# Patient Record
Sex: Male | Born: 1990 | Race: Black or African American | Hispanic: No | Marital: Single | Smoking: Current every day smoker
Health system: Southern US, Community
[De-identification: ages and names within clinical notes are randomized; demographics above are authoritative.]

## PROBLEM LIST (undated history)

## (undated) DIAGNOSIS — Z9109 Other allergy status, other than to drugs and biological substances: Secondary | ICD-10-CM

## (undated) DIAGNOSIS — M419 Scoliosis, unspecified: Secondary | ICD-10-CM

## (undated) DIAGNOSIS — S72413A Displaced unspecified condyle fracture of lower end of unspecified femur, initial encounter for closed fracture: Secondary | ICD-10-CM

## (undated) DIAGNOSIS — S62309A Unspecified fracture of unspecified metacarpal bone, initial encounter for closed fracture: Secondary | ICD-10-CM

## (undated) HISTORY — DX: Displaced unspecified condyle fracture of lower end of unspecified femur, initial encounter for closed fracture: S72.413A

---
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
## 2010-03-09 ENCOUNTER — Emergency Department (HOSPITAL_COMMUNITY): Admission: EM | Admit: 2010-03-09 | Discharge: 2010-03-09 | Payer: Self-pay | Admitting: Emergency Medicine

## 2011-06-21 LAB — CBC
MCHC: 34
Platelets: 369
RDW: 14.1 — ABNORMAL HIGH

## 2011-06-21 LAB — RAPID URINE DRUG SCREEN, HOSP PERFORMED
Amphetamines: NOT DETECTED
Opiates: NOT DETECTED
Tetrahydrocannabinol: POSITIVE — AB

## 2011-06-21 LAB — COMPREHENSIVE METABOLIC PANEL
ALT: 13
AST: 24
Albumin: 4.6
Calcium: 9.3
Potassium: 3.9
Sodium: 143
Total Protein: 7.6

## 2011-06-21 LAB — DIFFERENTIAL
Eosinophils Absolute: 0.1
Lymphs Abs: 3.1
Monocytes Absolute: 0.5
Monocytes Relative: 7
Neutrophils Relative %: 45

## 2011-06-21 LAB — ETHANOL: Alcohol, Ethyl (B): 223 — ABNORMAL HIGH

## 2011-06-21 LAB — ACETAMINOPHEN LEVEL: Acetaminophen (Tylenol), Serum: <10 — ABNORMAL LOW

## 2013-12-21 ENCOUNTER — Emergency Department (HOSPITAL_COMMUNITY)
Admission: EM | Admit: 2013-12-21 | Discharge: 2013-12-21 | Disposition: A | Discharge status: Home / Self Care | Payer: Self-pay | Attending: Emergency Medicine | Admitting: Emergency Medicine

## 2013-12-21 ENCOUNTER — Encounter (HOSPITAL_COMMUNITY): Payer: Self-pay | Admitting: Emergency Medicine

## 2013-12-21 DIAGNOSIS — F17200 Nicotine dependence, unspecified, uncomplicated: Secondary | ICD-10-CM | POA: Insufficient documentation

## 2013-12-21 DIAGNOSIS — IMO0002 Reserved for concepts with insufficient information to code with codable children: Secondary | ICD-10-CM | POA: Insufficient documentation

## 2013-12-21 DIAGNOSIS — S6390XA Sprain of unspecified part of unspecified wrist and hand, initial encounter: Secondary | ICD-10-CM | POA: Insufficient documentation

## 2013-12-21 DIAGNOSIS — S199XXA Unspecified injury of neck, initial encounter: Principal | ICD-10-CM

## 2013-12-21 DIAGNOSIS — S63609A Unspecified sprain of unspecified thumb, initial encounter: Secondary | ICD-10-CM

## 2013-12-21 DIAGNOSIS — S0990XA Unspecified injury of head, initial encounter: Secondary | ICD-10-CM | POA: Insufficient documentation

## 2013-12-21 DIAGNOSIS — S0993XA Unspecified injury of face, initial encounter: Secondary | ICD-10-CM | POA: Insufficient documentation

## 2013-12-21 MED ORDER — PENICILLIN V POTASSIUM 500 MG PO TABS: 500.0000 mg | ORAL_TABLET | Freq: Four times a day (QID) | ORAL | Status: AC

## 2013-12-21 MED ORDER — HYDROCODONE-ACETAMINOPHEN 5-325 MG PO TABS: 2.0000 | ORAL_TABLET | ORAL | Status: DC | PRN

## 2013-12-21 MED ORDER — SULFAMETHOXAZOLE-TRIMETHOPRIM 800-160 MG PO TABS: 1.0000 | ORAL_TABLET | Freq: Two times a day (BID) | ORAL | Status: DC
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
## 2013-12-21 NOTE — ED Notes (Signed)
Patient with no complaints at this time. Respirations even and unlabored. Skin warm/dry. Discharge instructions reviewed with patient at this time. Patient given opportunity to voice concerns/ask questions. Patient discharged at this time and left Emergency Department with steady gait.   

## 2014-03-31 ENCOUNTER — Ambulatory Visit (HOSPITAL_COMMUNITY)
Admission: EM | Admit: 2014-03-31 | Discharge: 2014-04-01 | Disposition: A | Discharge status: Home / Self Care | Payer: Self-pay | Attending: Emergency Medicine | Admitting: Emergency Medicine

## 2014-03-31 ENCOUNTER — Emergency Department (HOSPITAL_COMMUNITY): Payer: Self-pay

## 2014-03-31 ENCOUNTER — Emergency Department (HOSPITAL_COMMUNITY): Payer: Self-pay | Admitting: Anesthesiology

## 2014-03-31 ENCOUNTER — Encounter (HOSPITAL_COMMUNITY)
Admission: EM | Disposition: A | Discharge status: Home / Self Care | Payer: Self-pay | Source: Home / Self Care | Attending: Emergency Medicine

## 2014-03-31 ENCOUNTER — Encounter (HOSPITAL_COMMUNITY): Payer: Self-pay | Admitting: Emergency Medicine

## 2014-03-31 ENCOUNTER — Encounter (HOSPITAL_COMMUNITY): Payer: Self-pay | Admitting: Anesthesiology

## 2014-03-31 DIAGNOSIS — T148XXA Other injury of unspecified body region, initial encounter: Secondary | ICD-10-CM

## 2014-03-31 DIAGNOSIS — S62309A Unspecified fracture of unspecified metacarpal bone, initial encounter for closed fracture: Secondary | ICD-10-CM

## 2014-03-31 DIAGNOSIS — S62309B Unspecified fracture of unspecified metacarpal bone, initial encounter for open fracture: Secondary | ICD-10-CM | POA: Insufficient documentation

## 2014-03-31 DIAGNOSIS — S81009A Unspecified open wound, unspecified knee, initial encounter: Secondary | ICD-10-CM | POA: Insufficient documentation

## 2014-03-31 DIAGNOSIS — S72413A Displaced unspecified condyle fracture of lower end of unspecified femur, initial encounter for closed fracture: Secondary | ICD-10-CM

## 2014-03-31 DIAGNOSIS — S81809A Unspecified open wound, unspecified lower leg, initial encounter: Secondary | ICD-10-CM

## 2014-03-31 DIAGNOSIS — S72411B Displaced unspecified condyle fracture of lower end of right femur, initial encounter for open fracture type I or II: Secondary | ICD-10-CM

## 2014-03-31 DIAGNOSIS — S72413B Displaced unspecified condyle fracture of lower end of unspecified femur, initial encounter for open fracture type I or II: Secondary | ICD-10-CM | POA: Insufficient documentation

## 2014-03-31 DIAGNOSIS — W3400XA Accidental discharge from unspecified firearms or gun, initial encounter: Secondary | ICD-10-CM | POA: Insufficient documentation

## 2014-03-31 DIAGNOSIS — F17200 Nicotine dependence, unspecified, uncomplicated: Secondary | ICD-10-CM | POA: Insufficient documentation

## 2014-03-31 DIAGNOSIS — S91009A Unspecified open wound, unspecified ankle, initial encounter: Secondary | ICD-10-CM

## 2014-03-31 DIAGNOSIS — S62308B Unspecified fracture of other metacarpal bone, initial encounter for open fracture: Secondary | ICD-10-CM

## 2014-03-31 DIAGNOSIS — Z79899 Other long term (current) drug therapy: Secondary | ICD-10-CM | POA: Insufficient documentation

## 2014-03-31 HISTORY — PX: KNEE ARTHROTOMY: SHX5881

## 2014-03-31 HISTORY — DX: Displaced unspecified condyle fracture of lower end of unspecified femur, initial encounter for closed fracture: S72.413A

## 2014-03-31 HISTORY — PX: KNEE ARTHROTOMY: SUR107

## 2014-03-31 HISTORY — DX: Unspecified fracture of unspecified metacarpal bone, initial encounter for closed fracture: S62.309A

## 2014-03-31 LAB — BASIC METABOLIC PANEL
Anion gap: 19 — ABNORMAL HIGH (ref 5–15)
BUN: 12 mg/dL (ref 6–23)
CO2: 22 mEq/L (ref 19–32)
Calcium: 9.6 mg/dL (ref 8.4–10.5)
Chloride: 102 mEq/L (ref 96–112)
Creatinine, Ser: 1.06 mg/dL (ref 0.50–1.35)
GFR calc Af Amer: >90 mL/min (ref >90)
GFR calc non Af Amer: >90 mL/min (ref >90)
Glucose, Bld: 145 mg/dL — ABNORMAL HIGH (ref 70–99)
Potassium: 3.1 mEq/L — ABNORMAL LOW (ref 3.7–5.3)
Sodium: 143 mEq/L (ref 137–147)

## 2014-03-31 LAB — CBC WITH DIFFERENTIAL/PLATELET
Basophils Absolute: 0.1 10*3/uL (ref 0.0–0.1)
Basophils Relative: 1 % (ref 0–1)
Eosinophils Absolute: 0.2 10*3/uL (ref 0.0–0.7)
Eosinophils Relative: 2 % (ref 0–5)
HCT: 43.5 % (ref 39.0–52.0)
Hemoglobin: 15.1 g/dL (ref 13.0–17.0)
Lymphocytes Relative: 37 % (ref 12–46)
Lymphs Abs: 4.1 10*3/uL — ABNORMAL HIGH (ref 0.7–4.0)
MCH: 30.3 pg (ref 26.0–34.0)
MCHC: 34.7 g/dL (ref 30.0–36.0)
MCV: 87.2 fL (ref 78.0–100.0)
Monocytes Absolute: 1 10*3/uL (ref 0.1–1.0)
Monocytes Relative: 9 % (ref 3–12)
Neutro Abs: 5.6 10*3/uL (ref 1.7–7.7)
Neutrophils Relative %: 51 % (ref 43–77)
Platelets: 263 10*3/uL (ref 150–400)
RBC: 4.99 MIL/uL (ref 4.22–5.81)
RDW: 13.1 % (ref 11.5–15.5)
WBC: 10.9 10*3/uL — ABNORMAL HIGH (ref 4.0–10.5)

## 2014-03-31 LAB — TYPE AND SCREEN
ABO/RH(D): A POS
Antibody Screen: NEGATIVE

## 2014-03-31 SURGERY — ARTHROTOMY, KNEE
Anesthesia: General | Site: Knee | Laterality: Right

## 2014-03-31 MED ORDER — SUCCINYLCHOLINE CHLORIDE 20 MG/ML IJ SOLN
INTRAMUSCULAR | Status: DC | PRN
  Administered 2014-03-31: 150 mg via INTRAVENOUS

## 2014-03-31 MED ORDER — ROCURONIUM BROMIDE 50 MG/5ML IV SOLN
INTRAVENOUS | Status: AC
  Filled 2014-03-31: qty 1

## 2014-03-31 MED ORDER — OXYCODONE HCL 5 MG PO TABS
5.0000 mg | ORAL_TABLET | ORAL | Status: DC | PRN
  Administered 2014-04-01: 10 mg via ORAL
  Filled 2014-03-31: qty 2

## 2014-03-31 MED ORDER — ONDANSETRON HCL 4 MG PO TABS: 4.0000 mg | ORAL_TABLET | Freq: Four times a day (QID) | ORAL | Status: DC | PRN

## 2014-03-31 MED ORDER — ONDANSETRON HCL 4 MG/2ML IJ SOLN: 4.0000 mg | Freq: Four times a day (QID) | INTRAMUSCULAR | Status: DC | PRN

## 2014-03-31 MED ORDER — MIDAZOLAM HCL 2 MG/2ML IJ SOLN
INTRAMUSCULAR | Status: AC
  Filled 2014-03-31: qty 2

## 2014-03-31 MED ORDER — LIDOCAINE HCL 1 % IJ SOLN
INTRAMUSCULAR | Status: DC | PRN
  Administered 2014-03-31: 40 mg via INTRADERMAL

## 2014-03-31 MED ORDER — SODIUM CHLORIDE 0.9 % IR SOLN
Status: DC | PRN
  Administered 2014-03-31: 3000 mL

## 2014-03-31 MED ORDER — LACTATED RINGERS IV SOLN
INTRAVENOUS | Status: DC | PRN
  Administered 2014-03-31: 18:00:00 via INTRAVENOUS

## 2014-03-31 MED ORDER — HYDROCODONE-ACETAMINOPHEN 5-325 MG PO TABS
1.0000 | ORAL_TABLET | Freq: Four times a day (QID) | ORAL | Status: DC | PRN
  Administered 2014-03-31: 1 via ORAL
  Administered 2014-04-01: 2 via ORAL
  Filled 2014-03-31: qty 1
  Filled 2014-03-31: qty 2

## 2014-03-31 MED ORDER — GLYCOPYRROLATE 0.2 MG/ML IJ SOLN
INTRAMUSCULAR | Status: AC
  Filled 2014-03-31: qty 2

## 2014-03-31 MED ORDER — BUPIVACAINE-EPINEPHRINE (PF) 0.5% -1:200000 IJ SOLN
INTRAMUSCULAR | Status: AC
  Filled 2014-03-31: qty 30

## 2014-03-31 MED ORDER — FENTANYL CITRATE 0.05 MG/ML IJ SOLN
INTRAMUSCULAR | Status: DC | PRN
  Administered 2014-03-31 (×3): 50 ug via INTRAVENOUS
  Administered 2014-03-31 (×2): 100 ug via INTRAVENOUS

## 2014-03-31 MED ORDER — MORPHINE SULFATE 2 MG/ML IJ SOLN
0.5000 mg | INTRAMUSCULAR | Status: DC | PRN
  Administered 2014-04-01: 0.5 mg via INTRAVENOUS
  Filled 2014-03-31 (×2): qty 1

## 2014-03-31 MED ORDER — ALUM & MAG HYDROXIDE-SIMETH 200-200-20 MG/5ML PO SUSP: 30.0000 mL | ORAL | Status: DC | PRN

## 2014-03-31 MED ORDER — CEPHALEXIN 500 MG PO CAPS: 500.0000 mg | ORAL_CAPSULE | Freq: Four times a day (QID) | ORAL | Status: DC

## 2014-03-31 MED ORDER — MENTHOL 3 MG MT LOZG: 1.0000 | LOZENGE | OROMUCOSAL | Status: DC | PRN

## 2014-03-31 MED ORDER — PROPOFOL 10 MG/ML IV EMUL
INTRAVENOUS | Status: AC
  Filled 2014-03-31: qty 20

## 2014-03-31 MED ORDER — CEFAZOLIN SODIUM-DEXTROSE 2-3 GM-% IV SOLR
2.0000 g | Freq: Four times a day (QID) | INTRAVENOUS | Status: AC
  Administered 2014-03-31 – 2014-04-01 (×2): 2 g via INTRAVENOUS
  Filled 2014-03-31 (×2): qty 50

## 2014-03-31 MED ORDER — SODIUM CHLORIDE 0.9 % IV BOLUS (SEPSIS)
1000.0000 mL | Freq: Once | INTRAVENOUS | Status: AC
  Administered 2014-03-31: 1000 mL via INTRAVENOUS

## 2014-03-31 MED ORDER — DEXAMETHASONE SODIUM PHOSPHATE 4 MG/ML IJ SOLN
INTRAMUSCULAR | Status: DC | PRN
  Administered 2014-03-31: 4 mg via INTRAVENOUS

## 2014-03-31 MED ORDER — LIDOCAINE HCL (PF) 1 % IJ SOLN
INTRAMUSCULAR | Status: AC
  Filled 2014-03-31: qty 5

## 2014-03-31 MED ORDER — PHENOL 1.4 % MT LIQD: 1.0000 | OROMUCOSAL | Status: DC | PRN

## 2014-03-31 MED ORDER — SODIUM CHLORIDE 0.9 % IV SOLN
INTRAVENOUS | Status: DC | PRN
  Administered 2014-03-31: 13:00:00 via INTRAVENOUS

## 2014-03-31 MED ORDER — METOCLOPRAMIDE HCL 10 MG PO TABS: 5.0000 mg | ORAL_TABLET | Freq: Three times a day (TID) | ORAL | Status: DC | PRN

## 2014-03-31 MED ORDER — FENTANYL CITRATE 0.05 MG/ML IJ SOLN
INTRAMUSCULAR | Status: AC
  Filled 2014-03-31: qty 2

## 2014-03-31 MED ORDER — ONDANSETRON HCL 4 MG/2ML IJ SOLN
INTRAMUSCULAR | Status: AC
  Filled 2014-03-31: qty 2

## 2014-03-31 MED ORDER — MORPHINE SULFATE 4 MG/ML IJ SOLN
6.0000 mg | Freq: Once | INTRAMUSCULAR | Status: AC
  Administered 2014-03-31: 6 mg via INTRAVENOUS
  Filled 2014-03-31: qty 2

## 2014-03-31 MED ORDER — GLYCOPYRROLATE 0.2 MG/ML IJ SOLN
INTRAMUSCULAR | Status: DC | PRN
  Administered 2014-03-31: 0.4 mg via INTRAVENOUS

## 2014-03-31 MED ORDER — CEFAZOLIN SODIUM-DEXTROSE 2-3 GM-% IV SOLR
INTRAVENOUS | Status: AC
  Filled 2014-03-31: qty 50

## 2014-03-31 MED ORDER — ONDANSETRON HCL 4 MG/2ML IJ SOLN
4.0000 mg | Freq: Once | INTRAMUSCULAR | Status: AC
  Administered 2014-03-31: 4 mg via INTRAMUSCULAR
  Filled 2014-03-31: qty 2

## 2014-03-31 MED ORDER — DOCUSATE SODIUM 100 MG PO CAPS
100.0000 mg | ORAL_CAPSULE | Freq: Two times a day (BID) | ORAL | Status: DC
  Administered 2014-03-31 – 2014-04-01 (×2): 100 mg via ORAL
  Filled 2014-03-31 (×2): qty 1

## 2014-03-31 MED ORDER — METOCLOPRAMIDE HCL 5 MG/ML IJ SOLN: 5.0000 mg | Freq: Three times a day (TID) | INTRAMUSCULAR | Status: DC | PRN

## 2014-03-31 MED ORDER — POTASSIUM CHLORIDE IN NACL 20-0.9 MEQ/L-% IV SOLN
INTRAVENOUS | Status: DC
  Administered 2014-03-31: 22:00:00 via INTRAVENOUS

## 2014-03-31 MED ORDER — MIDAZOLAM HCL 5 MG/5ML IJ SOLN
INTRAMUSCULAR | Status: DC | PRN
  Administered 2014-03-31 (×2): 2 mg via INTRAVENOUS

## 2014-03-31 MED ORDER — PROPOFOL 10 MG/ML IV BOLUS
INTRAVENOUS | Status: DC | PRN
  Administered 2014-03-31: 150 mg via INTRAVENOUS

## 2014-03-31 MED ORDER — FENTANYL CITRATE 0.05 MG/ML IJ SOLN
INTRAMUSCULAR | Status: AC
  Filled 2014-03-31: qty 5

## 2014-03-31 MED ORDER — DEXAMETHASONE SODIUM PHOSPHATE 4 MG/ML IJ SOLN
INTRAMUSCULAR | Status: AC
  Filled 2014-03-31: qty 1

## 2014-03-31 MED ORDER — POTASSIUM CHLORIDE 10 MEQ/100ML IV SOLN
10.0000 meq | Freq: Once | INTRAVENOUS | Status: AC
  Administered 2014-03-31: 10 meq via INTRAVENOUS
  Filled 2014-03-31: qty 100

## 2014-03-31 MED ORDER — 0.9 % SODIUM CHLORIDE (POUR BTL) OPTIME
TOPICAL | Status: DC | PRN
  Administered 2014-03-31: 1000 mL

## 2014-03-31 MED ORDER — OXYCODONE-ACETAMINOPHEN 5-325 MG PO TABS: 1.0000 | ORAL_TABLET | ORAL | Status: DC | PRN

## 2014-03-31 MED ORDER — ROCURONIUM BROMIDE 100 MG/10ML IV SOLN
INTRAVENOUS | Status: DC | PRN
  Administered 2014-03-31: 25 mg via INTRAVENOUS

## 2014-03-31 MED ORDER — TETANUS-DIPHTH-ACELL PERTUSSIS 5-2.5-18.5 LF-MCG/0.5 IM SUSP
0.5000 mL | Freq: Once | INTRAMUSCULAR | Status: AC
  Administered 2014-03-31: 0.5 mL via INTRAMUSCULAR
  Filled 2014-03-31: qty 0.5

## 2014-03-31 MED ORDER — NEOSTIGMINE METHYLSULFATE 10 MG/10ML IV SOLN
INTRAVENOUS | Status: DC | PRN
  Administered 2014-03-31: 1 mg via INTRAVENOUS

## 2014-03-31 MED ORDER — CEFAZOLIN SODIUM 1-5 GM-% IV SOLN
1.0000 g | Freq: Once | INTRAVENOUS | Status: AC
  Administered 2014-03-31: 1 g via INTRAVENOUS
  Filled 2014-03-31: qty 50

## 2014-03-31 MED ORDER — ONDANSETRON HCL 4 MG/2ML IJ SOLN
INTRAMUSCULAR | Status: DC | PRN
  Administered 2014-03-31: 4 mg via INTRAVENOUS

## 2014-03-31 SURGICAL SUPPLY — 53 items
BAG HAMPER (MISCELLANEOUS) ×3 IMPLANT
BANDAGE ELASTIC 3 VELCRO ST LF (GAUZE/BANDAGES/DRESSINGS) ×3 IMPLANT
BANDAGE ELASTIC 6 VELCRO NS (GAUZE/BANDAGES/DRESSINGS) ×3 IMPLANT
BANDAGE ESMARK 4X12 BL STRL LF (DISPOSABLE) ×2 IMPLANT
BNDG COHESIVE 4X5 TAN STRL (GAUZE/BANDAGES/DRESSINGS) ×3 IMPLANT
BNDG ESMARK 4X12 BLUE STRL LF (DISPOSABLE) ×6
BNDG GAUZE ELAST 4 BULKY (GAUZE/BANDAGES/DRESSINGS) IMPLANT
CHLORAPREP W/TINT 26ML (MISCELLANEOUS) IMPLANT
CLOTH BEACON ORANGE TIMEOUT ST (SAFETY) ×3 IMPLANT
COVER LIGHT HANDLE STERIS (MISCELLANEOUS) ×6 IMPLANT
COVER MAYO STAND XLG (DRAPE) ×6 IMPLANT
CUFF TOURNIQUET SINGLE 34IN LL (TOURNIQUET CUFF) ×3 IMPLANT
DECANTER SPIKE VIAL GLASS SM (MISCELLANEOUS) IMPLANT
DRAPE C-ARMOR (DRAPES) ×3 IMPLANT
DRAPE PROXIMA HALF (DRAPES) ×3 IMPLANT
ELECT REM PT RETURN 9FT ADLT (ELECTROSURGICAL) ×3
ELECTRODE REM PT RTRN 9FT ADLT (ELECTROSURGICAL) ×1 IMPLANT
EVACUATOR 3/16  PVC DRAIN (DRAIN) ×2
EVACUATOR 3/16 PVC DRAIN (DRAIN) ×1 IMPLANT
GAUZE SPONGE 4X4 12PLY STRL (GAUZE/BANDAGES/DRESSINGS) ×3 IMPLANT
GAUZE XEROFORM 5X9 LF (GAUZE/BANDAGES/DRESSINGS) ×3 IMPLANT
GLOVE BIOGEL M 7.0 STRL (GLOVE) ×3 IMPLANT
GLOVE BIOGEL PI IND STRL 7.0 (GLOVE) ×2 IMPLANT
GLOVE BIOGEL PI INDICATOR 7.0 (GLOVE) ×4
GLOVE ECLIPSE 6.5 STRL STRAW (GLOVE) ×3 IMPLANT
GLOVE EXAM NITRILE MD LF STRL (GLOVE) ×3 IMPLANT
GLOVE SKINSENSE NS SZ8.0 LF (GLOVE) ×2
GLOVE SKINSENSE STRL SZ8.0 LF (GLOVE) ×1 IMPLANT
GLOVE SS N UNI LF 8.5 STRL (GLOVE) ×3 IMPLANT
GOWN STRL REUS W/TWL LRG LVL3 (GOWN DISPOSABLE) ×12 IMPLANT
GOWN STRL REUS W/TWL XL LVL3 (GOWN DISPOSABLE) ×3 IMPLANT
HANDPIECE INTERPULSE COAX TIP (DISPOSABLE) ×2
IMMOBILIZER KNEE 19 UNV (ORTHOPEDIC SUPPLIES) ×3 IMPLANT
IV NS IRRIG 3000ML ARTHROMATIC (IV SOLUTION) ×3 IMPLANT
KIT ROOM TURNOVER APOR (KITS) ×3 IMPLANT
MANIFOLD NEPTUNE II (INSTRUMENTS) ×3 IMPLANT
NS IRRIG 1000ML POUR BTL (IV SOLUTION) ×3 IMPLANT
PACK BASIC LIMB (CUSTOM PROCEDURE TRAY) ×3 IMPLANT
PAD ABD 5X9 TENDERSORB (GAUZE/BANDAGES/DRESSINGS) ×3 IMPLANT
PAD ARMBOARD 7.5X6 YLW CONV (MISCELLANEOUS) ×3 IMPLANT
PAD CAST 3X4 CTTN HI CHSV (CAST SUPPLIES) ×1 IMPLANT
PADDING CAST COTTON 3X4 STRL (CAST SUPPLIES) ×2
PADDING WEBRIL 6 STERILE (GAUZE/BANDAGES/DRESSINGS) ×3 IMPLANT
SET BASIN LINEN APH (SET/KITS/TRAYS/PACK) ×3 IMPLANT
SET HNDPC FAN SPRY TIP SCT (DISPOSABLE) ×1 IMPLANT
SOL PREP PROV IODINE SCRUB 4OZ (MISCELLANEOUS) ×3 IMPLANT
SPONGE DRAIN TRACH 4X4 STRL 2S (GAUZE/BANDAGES/DRESSINGS) ×3 IMPLANT
SPONGE GAUZE 4X4 12PLY (GAUZE/BANDAGES/DRESSINGS) ×2 IMPLANT
STAPLER VISISTAT 35W (STAPLE) ×3 IMPLANT
SUT BRALON NAB BRD #1 30IN (SUTURE) ×3 IMPLANT
SUT ETHILON 3 0 FSL (SUTURE) IMPLANT
SUT MON AB 2-0 CT1 36 (SUTURE) ×6 IMPLANT
SYR BULB IRRIGATION 50ML (SYRINGE) ×3 IMPLANT

## 2014-03-31 NOTE — Anesthesia Postprocedure Evaluation (Signed)
  Anesthesia Post-op Note  Patient: Charles [name]  Procedure(s) Performed: Procedure(s): KNEE ARTHROTOMY (Right)  Patient Location: PACU  Anesthesia Type:General  Level of Consciousness: awake, alert , oriented and patient cooperative  Airway and Oxygen Therapy: Patient Spontanous Breathing  Post-op Pain: 3 /10, mild  Post-op Assessment: Post-op Vital signs reviewed, Patient's Cardiovascular Status Stable, Respiratory Function Stable, Patent Airway and Pain level controlled  Post-op Vital Signs: Reviewed and stable  Last Vitals:  Filed Vitals:   03/31/14 1730  BP: 120/77  Pulse:   Resp:     Complications: No apparent anesthesia complications
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
## 2014-03-31 NOTE — ED Notes (Signed)
Dr. Romeo AppleHarrison states he will apply splint to right hand/forearm in OR.
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
## 2014-03-31 NOTE — Discharge Instructions (Signed)
Cast or Splint Care °Casts and splints support injured limbs and keep bones from moving while they heal. It is important to care for your cast or splint at home.   °HOME CARE INSTRUCTIONS °· Keep the cast or splint uncovered during the drying period. It can take 24 to 48 hours to dry if it is made of plaster. A fiberglass cast will dry in less than 1 hour. °· Do not rest the cast on anything harder than a pillow for the first 24 hours. °· Do not put weight on your injured limb or apply pressure to the cast until your health care provider gives you permission. °· Keep the cast or splint dry. Wet casts or splints can lose their shape and may not support the limb as well. A wet cast that has lost its shape can also create harmful pressure on your skin when it dries. Also, wet skin can become infected. °¨ Cover the cast or splint with a plastic bag when bathing or when out in the rain or snow. If the cast is on the trunk of the body, take sponge baths until the cast is removed. °¨ If your cast does become wet, dry it with a towel or a blow dryer on the cool setting only. °· Keep your cast or splint clean. Soiled casts may be wiped with a moistened cloth. °· Do not place any hard or soft foreign objects under your cast or splint, such as cotton, toilet paper, lotion, or powder. °· Do not try to scratch the skin under the cast with any object. The object could get stuck inside the cast. Also, scratching could lead to an infection. If itching is a problem, use a blow dryer on a cool setting to relieve discomfort. °· Do not trim or cut your cast or remove padding from inside of it. °· Exercise all joints next to the injury that are not immobilized by the cast or splint. For example, if you have a long leg cast, exercise the hip joint and toes. If you have an arm cast or splint, exercise the shoulder, elbow, thumb, and fingers. °· Elevate your injured arm or leg on 1 or 2 pillows for the first 1 to 3 days to decrease  swelling and pain. It is best if you can comfortably elevate your cast so it is higher than your heart. °SEEK MEDICAL CARE IF:  °· Your cast or splint cracks. °· Your cast or splint is too tight or too loose. °· You have unbearable itching inside the cast. °· Your cast becomes wet or develops a soft spot or area. °· You have a bad smell coming from inside your cast. °· You get an object stuck under your cast. °· Your skin around the cast becomes red or raw. °· You have new pain or worsening pain after the cast has been applied. °SEEK IMMEDIATE MEDICAL CARE IF:  °· You have fluid leaking through the cast. °· You are unable to move your fingers or toes. °· You have discolored (blue or white), cool, painful, or very swollen fingers or toes beyond the cast. °· You have tingling or numbness around the injured area. °· You have severe pain or pressure under the cast. °· You have any difficulty with your breathing or have shortness of breath. °· You have chest pain. °Document Released: 08/19/2000 Document Revised: 06/12/2013 Document Reviewed: 02/28/2013 °ExitCare® Patient Information ©2015 [employer]. This information is not intended to replace advice given to you by your health care   provider. Make sure you discuss any questions you have with your health care provider.  Hand Fracture, Metacarpals Fractures of metacarpals are breaks in the bones of the hand. They extend from the knuckles to the wrist. These bones can undergo many types of fractures. There are different ways of treating these fractures, all of which may be correct. TREATMENT  Hand fractures can be treated with:   Non-reduction - The fracture is casted without changing the positions of the fracture (bone pieces) involved. This fracture is usually left in a cast for 4 to 6 weeks or as your caregiver thinks necessary.  Closed reduction - The bones are moved back into position without surgery and then casted.  ORIF (open reduction and internal  fixation) - The fracture site is opened and the bone pieces are fixed into place with some type of hardware, such as screws, etc. They are then casted. Your caregiver will discuss the type of fracture you have and the treatment that should be best for that problem. If surgery is chosen, let your caregivers know about the following.  LET YOUR CAREGIVERS KNOW ABOUT:  Allergies.  Medications you are taking, including herbs, eye drops, over the counter medications, and creams.  Use of steroids (by mouth or creams).  Previous problems with anesthetics or novocaine.  Possibility of pregnancy.  History of blood clots (thrombophlebitis).  History of bleeding or blood problems.  Previous surgeries.  Other health problems. AFTER THE PROCEDURE After surgery, you will be taken to the recovery area where a nurse will watch and check your progress. Once you are awake, stable, and taking fluids well, barring other problems, you'll be allowed to go home. Once home, an ice pack applied to your operative site may help with pain and keep the swelling down. HOME CARE INSTRUCTIONS   Follow your caregiver's instructions as to activities, exercises, physical therapy, and driving a car.  Daily exercise is helpful for keeping range of motion and strength. Exercise as instructed.  To lessen swelling, keep the injured hand elevated above the level of your heart as much as possible.  Apply ice to the injury for 15-20 minutes each hour while awake for the first 2 days. Put the ice in a plastic bag and place a thin towel between the bag of ice and your cast.  Move the fingers of your casted hand several times a day.  If a plaster or fiberglass cast was applied:  Do not try to scratch the skin under the cast using a sharp or pointed object.  Check the skin around the cast every day. You may put lotion on red or sore areas.  Keep your cast dry. Your cast can be protected during bathing with a plastic bag.  Do not put your cast into the water.  If a plaster splint was applied:  Wear your splint for as long as directed by your caregiver or until seen again.  Do not get your splint wet. Protect it during bathing with a plastic bag.  You may loosen the elastic bandage around the splint if your fingers start to get numb, tingle, get cold or turn blue.  Do not put pressure on your cast or splint; this may cause it to break. Especially, do not lean plaster casts on hard surfaces for 24 hours after application.  Take medications as directed by your caregiver.  Only take over-the-counter or prescription medicines for pain, discomfort, or fever as directed by your caregiver.  Follow-up as provided by  your caregiver. This is very important in order to avoid permanent injury or disability and chronic pain. SEEK MEDICAL CARE IF:   Increased bleeding (more than a small spot) from beneath your cast or splint if there is beneath the cast as with an open reduction.  Redness, swelling, or increasing pain in the wound or from beneath your cast or splint.  Pus coming from wound or from beneath your cast or splint.  An unexplained oral temperature above 102 F (38.9 C) develops, or as your caregiver suggests.  A foul smell coming from the wound or dressing or from beneath your cast or splint.  You have a problem moving any of your fingers. SEEK IMMEDIATE MEDICAL CARE IF:   You develop a rash  You have difficulty breathing  You have any allergy problems If you do not have a window in your cast for observing the wound, a discharge or minor bleeding may show up as a stain on the outside of your cast. Report these findings to your caregiver. MAKE SURE YOU:   Understand these instructions.  Will watch your condition.  Will get help right away if you are not doing well or get worse. Document Released: 08/22/2005 Document Revised: 11/14/2011 Document Reviewed: 04/10/2008 [facility] Patient  Information 2015 [place], [place]. This information is not intended to replace advice given to you by your health care provider. Make sure you discuss any questions you have with your health care provider.
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
## 2014-03-31 NOTE — Transfer of Care (Signed)
Immediate Anesthesia Transfer of Care Note  Patient: Charles [name]  Procedure(s) Performed: Procedure(s): KNEE ARTHROTOMY (Right)  Patient Location: PACU  Anesthesia Type:General  Level of Consciousness: awake and patient cooperative  Airway & Oxygen Therapy: Patient Spontanous Breathing and Patient connected to face mask oxygen  Post-op Assessment: Report given to PACU RN, Post -op Vital signs reviewed and stable and Patient moving all extremities  Post vital signs: Reviewed and stable  Complications: No apparent anesthesia complications
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
## 2014-03-31 NOTE — Anesthesia Procedure Notes (Signed)
Procedure Name: Intubation Date/Time: 03/31/2014 5:56 PM Performed by: [name], Charles [name] Pre-anesthesia Checklist: Emergency Drugs available, Suction available, Patient being monitored and Patient identified Patient Re-evaluated:Patient Re-evaluated prior to inductionOxygen Delivery Method: Circle system utilized Preoxygenation: Pre-oxygenation with 100% oxygen Intubation Type: IV induction, Cricoid Pressure applied and Rapid sequence Ventilation: Mask ventilation without difficulty and Oral airway inserted - appropriate to patient size Laryngoscope Size: Mac and 4 Grade View: Grade II Tube type: Oral Tube size: 7.0 mm Number of attempts: 1 Airway Equipment and Method: Stylet and Oral airway Placement Confirmation: ETT inserted through vocal cords under direct vision,  positive ETCO2 and breath sounds checked- equal and bilateral Secured at: 23 cm Tube secured with: Tape Dental Injury: Teeth and Oropharynx as per pre-operative assessment

## 2014-04-01 MED ORDER — CEPHALEXIN 500 MG PO CAPS: 500.0000 mg | ORAL_CAPSULE | Freq: Three times a day (TID) | ORAL | Status: DC

## 2014-04-01 MED ORDER — OXYCODONE-ACETAMINOPHEN 5-325 MG PO TABS: 1.0000 | ORAL_TABLET | ORAL | Status: DC | PRN
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
## 2014-04-01 NOTE — Addendum Note (Signed)
Addendum created 04/01/14 0817 by Earleen NewportAmy A Adams, CRNA   Modules edited: Notes Section   Notes Section:  File: [id number]

## 2014-04-01 NOTE — Anesthesia Postprocedure Evaluation (Signed)
  Anesthesia Post-op Note  Patient: Charles [name]  Procedure(s) Performed: Procedure(s): KNEE ARTHROTOMY (Right)  Patient Location: Room 304  Anesthesia Type:General  Level of Consciousness: awake, alert , oriented and patient cooperative  Airway and Oxygen Therapy: Patient Spontanous Breathing  Post-op Pain: mild  Post-op Assessment: Post-op Vital signs reviewed, Patient's Cardiovascular Status Stable, Respiratory Function Stable, Patent Airway, No signs of Nausea or vomiting and Pain level controlled  Post-op Vital Signs: Reviewed and stable  Last Vitals:  Filed Vitals:   04/01/14 0652  BP: 127/73  Pulse: 52  Temp: 37 C  Resp: 16    Complications: No apparent anesthesia complications

## 2014-04-02 ENCOUNTER — Ambulatory Visit (INDEPENDENT_AMBULATORY_CARE_PROVIDER_SITE_OTHER): Payer: Self-pay | Admitting: Orthopedic Surgery

## 2014-04-02 ENCOUNTER — Encounter: Payer: Self-pay | Admitting: Orthopedic Surgery

## 2014-04-02 VITALS — Ht 71.0 in | Wt 142.0 lb

## 2014-04-02 DIAGNOSIS — S72413B Displaced unspecified condyle fracture of lower end of unspecified femur, initial encounter for open fracture type I or II: Secondary | ICD-10-CM

## 2014-04-02 DIAGNOSIS — S72411B Displaced unspecified condyle fracture of lower end of right femur, initial encounter for open fracture type I or II: Secondary | ICD-10-CM

## 2014-04-02 MED ORDER — OXYCODONE-ACETAMINOPHEN 5-325 MG PO TABS: 1.0000 | ORAL_TABLET | ORAL | Status: DC | PRN
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
## 2014-04-03 ENCOUNTER — Other Ambulatory Visit: Payer: Self-pay | Admitting: Orthopedic Surgery

## 2014-04-03 ENCOUNTER — Encounter (HOSPITAL_BASED_OUTPATIENT_CLINIC_OR_DEPARTMENT_OTHER): Payer: Self-pay | Admitting: *Deleted

## 2014-04-04 ENCOUNTER — Encounter (HOSPITAL_BASED_OUTPATIENT_CLINIC_OR_DEPARTMENT_OTHER)
Admission: RE | Disposition: A | Discharge status: Home / Self Care | Payer: Self-pay | Source: Ambulatory Visit | Attending: Orthopedic Surgery

## 2014-04-04 ENCOUNTER — Ambulatory Visit (HOSPITAL_BASED_OUTPATIENT_CLINIC_OR_DEPARTMENT_OTHER)
Admission: RE | Admit: 2014-04-04 | Discharge: 2014-04-04 | Disposition: A | Discharge status: Home / Self Care | Payer: Self-pay | Source: Ambulatory Visit | Attending: Orthopedic Surgery | Admitting: Orthopedic Surgery

## 2014-04-04 ENCOUNTER — Ambulatory Visit (HOSPITAL_BASED_OUTPATIENT_CLINIC_OR_DEPARTMENT_OTHER): Payer: Self-pay | Admitting: Certified Registered"

## 2014-04-04 ENCOUNTER — Encounter (HOSPITAL_BASED_OUTPATIENT_CLINIC_OR_DEPARTMENT_OTHER): Payer: Self-pay | Admitting: Certified Registered"

## 2014-04-04 ENCOUNTER — Encounter (HOSPITAL_BASED_OUTPATIENT_CLINIC_OR_DEPARTMENT_OTHER): Payer: Self-pay | Admitting: *Deleted

## 2014-04-04 DIAGNOSIS — Y249XXA Unspecified firearm discharge, undetermined intent, initial encounter: Secondary | ICD-10-CM | POA: Insufficient documentation

## 2014-04-04 DIAGNOSIS — M4120 Other idiopathic scoliosis, site unspecified: Secondary | ICD-10-CM | POA: Insufficient documentation

## 2014-04-04 DIAGNOSIS — S62309B Unspecified fracture of unspecified metacarpal bone, initial encounter for open fracture: Secondary | ICD-10-CM | POA: Insufficient documentation

## 2014-04-04 DIAGNOSIS — F17200 Nicotine dependence, unspecified, uncomplicated: Secondary | ICD-10-CM | POA: Insufficient documentation

## 2014-04-04 HISTORY — PX: WOUND EXPLORATION: SHX6188

## 2014-04-04 HISTORY — DX: Unspecified fracture of unspecified metacarpal bone, initial encounter for closed fracture: S62.309A

## 2014-04-04 HISTORY — DX: Other allergy status, other than to drugs and biological substances: Z91.09

## 2014-04-04 HISTORY — DX: Scoliosis, unspecified: M41.9

## 2014-04-04 LAB — POCT HEMOGLOBIN-HEMACUE: Hemoglobin: 12.5 g/dL — ABNORMAL LOW (ref 13.0–17.0)

## 2014-04-04 SURGERY — WOUND EXPLORATION
Anesthesia: Regional | Site: Hand | Laterality: Right

## 2014-04-04 MED ORDER — MIDAZOLAM HCL 2 MG/ML PO SYRP: 12.0000 mg | ORAL_SOLUTION | Freq: Once | ORAL | Status: DC | PRN

## 2014-04-04 MED ORDER — DEXAMETHASONE SODIUM PHOSPHATE 10 MG/ML IJ SOLN
INTRAMUSCULAR | Status: DC | PRN
  Administered 2014-04-04: 10 mg via INTRAVENOUS

## 2014-04-04 MED ORDER — LACTATED RINGERS IV SOLN
INTRAVENOUS | Status: DC
  Administered 2014-04-04 (×2): via INTRAVENOUS

## 2014-04-04 MED ORDER — MIDAZOLAM HCL 2 MG/2ML IJ SOLN
INTRAMUSCULAR | Status: AC
  Filled 2014-04-04: qty 2

## 2014-04-04 MED ORDER — LIDOCAINE HCL (PF) 1 % IJ SOLN
INTRAMUSCULAR | Status: AC
  Filled 2014-04-04: qty 30

## 2014-04-04 MED ORDER — OXYCODONE-ACETAMINOPHEN 5-325 MG PO TABS: 1.0000 | ORAL_TABLET | ORAL | Status: DC | PRN

## 2014-04-04 MED ORDER — FENTANYL CITRATE 0.05 MG/ML IJ SOLN
INTRAMUSCULAR | Status: AC
  Filled 2014-04-04: qty 6

## 2014-04-04 MED ORDER — ONDANSETRON HCL 4 MG/2ML IJ SOLN
4.0000 mg | Freq: Once | INTRAMUSCULAR | Status: DC | PRN
Start: 2014-04-04 — End: 2014-04-04

## 2014-04-04 MED ORDER — MIDAZOLAM HCL 2 MG/2ML IJ SOLN
1.0000 mg | INTRAMUSCULAR | Status: DC | PRN
  Administered 2014-04-04: 2 mg via INTRAVENOUS

## 2014-04-04 MED ORDER — LIDOCAINE HCL (CARDIAC) 20 MG/ML IV SOLN
INTRAVENOUS | Status: DC | PRN
  Administered 2014-04-04: 80 mg via INTRAVENOUS

## 2014-04-04 MED ORDER — ONDANSETRON HCL 4 MG/2ML IJ SOLN
INTRAMUSCULAR | Status: DC | PRN
  Administered 2014-04-04: 4 mg via INTRAVENOUS

## 2014-04-04 MED ORDER — LIDOCAINE HCL 2 % IJ SOLN
INTRAMUSCULAR | Status: AC
  Filled 2014-04-04: qty 20

## 2014-04-04 MED ORDER — BUPIVACAINE-EPINEPHRINE (PF) 0.5% -1:200000 IJ SOLN
INTRAMUSCULAR | Status: DC | PRN
  Administered 2014-04-04: 25 mL via PERINEURAL

## 2014-04-04 MED ORDER — BUPIVACAINE HCL (PF) 0.25 % IJ SOLN
INTRAMUSCULAR | Status: AC
  Filled 2014-04-04: qty 30

## 2014-04-04 MED ORDER — FENTANYL CITRATE 0.05 MG/ML IJ SOLN
INTRAMUSCULAR | Status: AC
  Filled 2014-04-04: qty 2

## 2014-04-04 MED ORDER — CEFAZOLIN SODIUM-DEXTROSE 2-3 GM-% IV SOLR
INTRAVENOUS | Status: AC
  Filled 2014-04-04: qty 50

## 2014-04-04 MED ORDER — CEFAZOLIN SODIUM-DEXTROSE 2-3 GM-% IV SOLR
2.0000 g | INTRAVENOUS | Status: AC
  Administered 2014-04-04: 2 g via INTRAVENOUS

## 2014-04-04 MED ORDER — HYDROMORPHONE HCL PF 1 MG/ML IJ SOLN: 0.2500 mg | INTRAMUSCULAR | Status: DC | PRN

## 2014-04-04 MED ORDER — PROPOFOL 10 MG/ML IV BOLUS
INTRAVENOUS | Status: DC | PRN
  Administered 2014-04-04: 200 mg via INTRAVENOUS

## 2014-04-04 MED ORDER — HEPARIN SODIUM (PORCINE) 1000 UNIT/ML IJ SOLN
INTRAMUSCULAR | Status: AC
  Filled 2014-04-04: qty 1

## 2014-04-04 MED ORDER — OXYCODONE HCL 5 MG/5ML PO SOLN: 5.0000 mg | Freq: Once | ORAL | Status: DC | PRN

## 2014-04-04 MED ORDER — CHLORHEXIDINE GLUCONATE 4 % EX LIQD: 60.0000 mL | Freq: Once | CUTANEOUS | Status: DC

## 2014-04-04 MED ORDER — OXYCODONE HCL 5 MG PO TABS: 5.0000 mg | ORAL_TABLET | Freq: Once | ORAL | Status: DC | PRN

## 2014-04-04 MED ORDER — FENTANYL CITRATE 0.05 MG/ML IJ SOLN
50.0000 ug | INTRAMUSCULAR | Status: DC | PRN
  Administered 2014-04-04: 100 ug via INTRAVENOUS

## 2014-04-04 MED ORDER — PROPOFOL 10 MG/ML IV BOLUS
INTRAVENOUS | Status: AC
  Filled 2014-04-04: qty 20

## 2014-04-04 SURGICAL SUPPLY — 77 items
APL SKNCLS STERI-STRIP NONHPOA (GAUZE/BANDAGES/DRESSINGS)
BAG DECANTER FOR FLEXI CONT (MISCELLANEOUS) IMPLANT
BANDAGE ELASTIC 4 VELCRO ST LF (GAUZE/BANDAGES/DRESSINGS) ×3 IMPLANT
BENZOIN TINCTURE PRP APPL 2/3 (GAUZE/BANDAGES/DRESSINGS) IMPLANT
BIT DRILL 1.5 (BIT) ×1
BIT DRILL 1.5MM (BIT) ×1 IMPLANT
BLADE MINI RND TIP GREEN BEAV (BLADE) IMPLANT
BLADE SURG 15 STRL LF DISP TIS (BLADE) ×1 IMPLANT
BLADE SURG 15 STRL SS (BLADE) ×3
BNDG CMPR 9X4 STRL LF SNTH (GAUZE/BANDAGES/DRESSINGS) ×1
BNDG CMPR MD 5X2 ELC HKLP STRL (GAUZE/BANDAGES/DRESSINGS) ×1
BNDG COHESIVE 1X5 TAN STRL LF (GAUZE/BANDAGES/DRESSINGS) IMPLANT
BNDG ELASTIC 2 VLCR STRL LF (GAUZE/BANDAGES/DRESSINGS) ×3 IMPLANT
BNDG ESMARK 4X9 LF (GAUZE/BANDAGES/DRESSINGS) ×3 IMPLANT
BNDG GAUZE ELAST 4 BULKY (GAUZE/BANDAGES/DRESSINGS) ×3 IMPLANT
CLOSURE WOUND 1/2 X4 (GAUZE/BANDAGES/DRESSINGS)
CORDS BIPOLAR (ELECTRODE) ×3 IMPLANT
COVER MAYO STAND STRL (DRAPES) ×3 IMPLANT
COVER TABLE BACK 60X90 (DRAPES) ×3 IMPLANT
CUFF TOURNIQUET SINGLE 18IN (TOURNIQUET CUFF) ×3 IMPLANT
DECANTER SPIKE VIAL GLASS SM (MISCELLANEOUS) IMPLANT
DRAPE EXTREMITY T 121X128X90 (DRAPE) ×3 IMPLANT
DRAPE OEC MINIVIEW 54X84 (DRAPES) ×3 IMPLANT
DRAPE SURG 17X23 STRL (DRAPES) ×3 IMPLANT
DRILL BIT 1.5MM (BIT) ×3
DURAPREP 26ML APPLICATOR (WOUND CARE) ×3 IMPLANT
GAUZE SPONGE 4X4 12PLY STRL (GAUZE/BANDAGES/DRESSINGS) ×6 IMPLANT
GAUZE SPONGE 4X4 16PLY XRAY LF (GAUZE/BANDAGES/DRESSINGS) IMPLANT
GAUZE XEROFORM 1X8 LF (GAUZE/BANDAGES/DRESSINGS) ×3 IMPLANT
GLOVE BIO SURGEON STRL SZ7 (GLOVE) ×3 IMPLANT
GLOVE BIO SURGEON STRL SZ7.5 (GLOVE) IMPLANT
GLOVE BIOGEL PI IND STRL 7.0 (GLOVE) ×1 IMPLANT
GLOVE BIOGEL PI INDICATOR 7.0 (GLOVE) ×2
GLOVE ECLIPSE 6.5 STRL STRAW (GLOVE) ×6 IMPLANT
GLOVE SURG SYN 8.0 (GLOVE) ×6 IMPLANT
GOWN STRL REUS W/ TWL LRG LVL3 (GOWN DISPOSABLE) ×1 IMPLANT
GOWN STRL REUS W/TWL LRG LVL3 (GOWN DISPOSABLE) ×3
GOWN STRL REUS W/TWL XL LVL3 (GOWN DISPOSABLE) ×3 IMPLANT
IV LACTATED RINGERS 500ML (IV SOLUTION) IMPLANT
K-WIRE .062X4 (WIRE) ×3 IMPLANT
NEEDLE HYPO 22GX1.5 SAFETY (NEEDLE) IMPLANT
NEEDLE HYPO 25X1 1.5 SAFETY (NEEDLE) ×3 IMPLANT
NS IRRIG 1000ML POUR BTL (IV SOLUTION) ×3 IMPLANT
PACK BASIN DAY SURGERY FS (CUSTOM PROCEDURE TRAY) ×3 IMPLANT
PAD CAST 3X4 CTTN HI CHSV (CAST SUPPLIES) ×1 IMPLANT
PADDING CAST ABS 4INX4YD NS (CAST SUPPLIES)
PADDING CAST ABS COTTON 4X4 ST (CAST SUPPLIES) IMPLANT
PADDING CAST COTTON 3X4 STRL (CAST SUPPLIES) ×3
PADDING UNDERCAST 2 STRL (CAST SUPPLIES)
PADDING UNDERCAST 2X4 STRL (CAST SUPPLIES) IMPLANT
PASSER SUT SWANSON 36MM LOOP (INSTRUMENTS) IMPLANT
PLATE LCP 8H 59MM 2.0MM (Plate) ×3 IMPLANT
SCREW CORTEX SLFTPNG 13MM (Screw) ×3 IMPLANT
SCREW SELF TAP 2.0X16 (Screw) ×9 IMPLANT
SCREW SELF TAP 2.0X18 (Screw) ×3 IMPLANT
SHEET MEDIUM DRAPE 40X70 STRL (DRAPES) ×3 IMPLANT
SPEAR EYE SURG WECK-CEL (MISCELLANEOUS) IMPLANT
SPLINT PLASTER CAST XFAST 4X15 (CAST SUPPLIES) ×15 IMPLANT
SPLINT PLASTER XTRA FAST SET 4 (CAST SUPPLIES) ×30
STOCKINETTE 4X48 STRL (DRAPES) ×3 IMPLANT
STRIP CLOSURE SKIN 1/2X4 (GAUZE/BANDAGES/DRESSINGS) IMPLANT
SUT ETHIBOND 3-0 V-5 (SUTURE) IMPLANT
SUT ETHILON 4 0 PS 2 18 (SUTURE) ×6 IMPLANT
SUT ETHILON 5 0 PS 2 18 (SUTURE) IMPLANT
SUT NYLON 9 0 VRM6 (SUTURE) IMPLANT
SUT PROLENE 3 0 PS 2 (SUTURE) IMPLANT
SUT PROLENE 6 0 P 1 18 (SUTURE) IMPLANT
SUT VIC AB 0 SH 27 (SUTURE) ×3 IMPLANT
SUT VIC AB 2-0 CT3 27 (SUTURE) ×3 IMPLANT
SUT VIC AB 2-0 PS2 27 (SUTURE) ×3 IMPLANT
SUT VICRYL RAPIDE 4-0 (SUTURE) ×3 IMPLANT
SUT VICRYL RAPIDE 4/0 PS 2 (SUTURE) IMPLANT
SYR BULB 3OZ (MISCELLANEOUS) ×3 IMPLANT
SYRINGE CONTROL L 12CC (SYRINGE) IMPLANT
TOWEL OR 17X24 6PK STRL BLUE (TOWEL DISPOSABLE) ×3 IMPLANT
TUBE FEEDING 5FR 15 INCH (TUBING) IMPLANT
UNDERPAD 30X30 INCONTINENT (UNDERPADS AND DIAPERS) ×3 IMPLANT
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
## 2014-04-04 NOTE — Transfer of Care (Signed)
Immediate Anesthesia Transfer of Care Note  Patient: Charles [name]  Procedure(s) Performed: Procedure(s): EXPLORE/REPAIR AS NEEDED RIGHT HAND  (Right)  Patient Location: PACU  Anesthesia Type:GA combined with regional for post-op pain  Level of Consciousness: awake and patient cooperative  Airway & Oxygen Therapy: Patient Spontanous Breathing and Patient connected to face mask oxygen  Post-op Assessment: Report given to PACU RN, Post -op Vital signs reviewed and stable and Patient moving all extremities  Post vital signs: Reviewed and stable  Complications: No apparent anesthesia complications

## 2014-04-04 NOTE — Discharge Instructions (Signed)
°  Post Anesthesia Home Care Instructions ° °Activity: °Get plenty of rest for the remainder of the day. A responsible adult should stay with you for 24 hours following the procedure.  °For the next 24 hours, DO NOT: °-Drive a car °-Operate machinery °-Drink alcoholic beverages °-Take any medication unless instructed by your physician °-Make any legal decisions or sign important papers. ° °Meals: °Start with liquid foods such as gelatin or soup. Progress to regular foods as tolerated. Avoid greasy, spicy, heavy foods. If nausea and/or vomiting occur, drink only clear liquids until the nausea and/or vomiting subsides. Call your physician if vomiting continues. ° °Special Instructions/Symptoms: °Your throat may feel dry or sore from the anesthesia or the breathing tube placed in your throat during surgery. If this causes discomfort, gargle with warm salt water. The discomfort should disappear within 24 hours. ° °Regional Anesthesia Blocks ° °1. Numbness or the inability to move the "blocked" extremity may last from 3-48 hours after placement. The length of time depends on the medication injected and your individual response to the medication. If the numbness is not going away after 48 hours, call your surgeon. ° °2. The extremity that is blocked will need to be protected until the numbness is gone and the  Strength has returned. Because you cannot feel it, you will need to take extra care to avoid injury. Because it may be weak, you may have difficulty moving it or using it. You may not know what position it is in without looking at it while the block is in effect. ° °3. For blocks in the legs and feet, returning to weight bearing and walking needs to be done carefully. You will need to wait until the numbness is entirely gone and the strength has returned. You should be able to move your leg and foot normally before you try and bear weight or walk. You will need someone to be with you when you first try to ensure you  do not fall and possibly risk injury. ° °4. Bruising and tenderness at the needle site are common side effects and will resolve in a few days. ° °5. Persistent numbness or new problems with movement should be communicated to the surgeon or the [facility] ([phone])/  Surgery Center ([phone]). °
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
## 2014-04-04 NOTE — Anesthesia Postprocedure Evaluation (Signed)
  Anesthesia Post-op Note  Patient: Molli HazardKendrell J Luhman  Procedure(s) Performed: Procedure(s): EXPLORE/REPAIR AS NEEDED RIGHT HAND  (Right)  Patient Location: PACU  Anesthesia Type: General, Regional   Level of Consciousness: awake, alert  and oriented  Airway and Oxygen Therapy: Patient Spontanous Breathing  Post-op Pain: none  Post-op Assessment: Post-op Vital signs reviewed  Post-op Vital Signs: Reviewed  Last Vitals:  Filed Vitals:   04/04/14 1500  BP: 158/82  Pulse: 101  Temp:   Resp: 21    Complications: No apparent anesthesia complications

## 2014-04-04 NOTE — Anesthesia Preprocedure Evaluation (Signed)
Anesthesia Evaluation  Patient identified by MRN, date of birth, ID band Patient awake    Reviewed: Allergy & Precautions, H&P , NPO status , Patient's Chart, lab work & pertinent test results  Airway Mallampati: I  TM Distance: >3 FB Neck ROM: Full    Dental  (+) Teeth Intact, Dental Advisory Given   Pulmonary Current Smoker,  breath sounds clear to auscultation        Cardiovascular Rhythm:Regular Rate:Normal     Neuro/Psych    GI/Hepatic   Endo/Other    Renal/GU      Musculoskeletal   Abdominal   Peds  Hematology   Anesthesia Other Findings   Reproductive/Obstetrics                             Anesthesia Physical Anesthesia Plan  ASA: I  Anesthesia Plan: General   Post-op Pain Management:    Induction: Intravenous  Airway Management Planned: LMA  Additional Equipment:   Intra-op Plan:   Post-operative Plan: Extubation in OR  Informed Consent: I have reviewed the patients History and Physical, chart, labs and discussed the procedure including the risks, benefits and alternatives for the proposed anesthesia with the patient or authorized representative who has indicated his/her understanding and acceptance.   Dental advisory given  Plan Discussed with: CRNA, Anesthesiologist and Surgeon  Anesthesia Plan Comments:         Anesthesia Quick Evaluation  

## 2014-04-04 NOTE — Op Note (Signed)
See note [phone]

## 2014-04-04 NOTE — Progress Notes (Signed)
Assisted Dr. Crews with right, ultrasound guided, supraclavicular block. Side rails up, monitors on throughout procedure. See vital signs in flow sheet. Tolerated Procedure well. 

## 2014-04-07 ENCOUNTER — Encounter (HOSPITAL_BASED_OUTPATIENT_CLINIC_OR_DEPARTMENT_OTHER): Payer: Self-pay | Admitting: Orthopedic Surgery
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
## 2014-04-08 NOTE — Addendum Note (Signed)
Addendum created 04/08/14 1938 by Kerby Noraavid A Ryley Bachtel, MD   Modules edited: Anesthesia Attestations, Anesthesia Blocks and Procedures, Clinical Notes   Clinical Notes:  File: [id number]

## 2014-04-10 ENCOUNTER — Encounter: Payer: Self-pay | Admitting: Orthopedic Surgery

## 2014-04-10 ENCOUNTER — Ambulatory Visit (INDEPENDENT_AMBULATORY_CARE_PROVIDER_SITE_OTHER): Payer: Self-pay | Admitting: Orthopedic Surgery

## 2014-04-10 VITALS — BP 129/90 | Ht 71.0 in | Wt 142.0 lb

## 2014-04-10 DIAGNOSIS — S72411B Displaced unspecified condyle fracture of lower end of right femur, initial encounter for open fracture type I or II: Secondary | ICD-10-CM

## 2014-04-10 DIAGNOSIS — S72413B Displaced unspecified condyle fracture of lower end of unspecified femur, initial encounter for open fracture type I or II: Secondary | ICD-10-CM

## 2014-04-10 MED ORDER — CEPHALEXIN 500 MG PO CAPS: 500.0000 mg | ORAL_CAPSULE | Freq: Four times a day (QID) | ORAL | Status: DC

## 2014-04-10 NOTE — Patient Instructions (Signed)
Wear brace unless bathing or sleeping
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
## 2014-04-14 ENCOUNTER — Ambulatory Visit: Payer: Self-pay | Admitting: Orthopedic Surgery

## 2014-04-15 ENCOUNTER — Ambulatory Visit (INDEPENDENT_AMBULATORY_CARE_PROVIDER_SITE_OTHER): Payer: Self-pay | Admitting: Orthopedic Surgery

## 2014-04-15 VITALS — BP 122/71 | Ht 71.0 in | Wt 142.0 lb

## 2014-04-15 DIAGNOSIS — S72411B Displaced unspecified condyle fracture of lower end of right femur, initial encounter for open fracture type I or II: Secondary | ICD-10-CM

## 2014-04-15 DIAGNOSIS — S72413B Displaced unspecified condyle fracture of lower end of unspecified femur, initial encounter for open fracture type I or II: Secondary | ICD-10-CM
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
## 2014-04-17 ENCOUNTER — Other Ambulatory Visit: Payer: Self-pay | Admitting: *Deleted

## 2014-04-17 ENCOUNTER — Telehealth: Payer: Self-pay | Admitting: Orthopedic Surgery

## 2014-04-17 MED ORDER — OXYCODONE-ACETAMINOPHEN 5-325 MG PO TABS: 1.0000 | ORAL_TABLET | ORAL | Status: DC | PRN

## 2014-04-17 MED ORDER — CEPHALEXIN 500 MG PO CAPS: 500.0000 mg | ORAL_CAPSULE | Freq: Four times a day (QID) | ORAL | Status: DC

## 2014-04-17 NOTE — Telephone Encounter (Signed)
Patient called to request refill on pain medication, Oxycodone-states still has one from 04/03/14 also(?).  States he may need his "infection" prescription refilled as well.  Please advise -  PLEASE CALL AT [facility] PH# [phone] (Mom's phone, as his phone non-working at this time.

## 2014-04-17 NOTE — Telephone Encounter (Signed)
Prescriptions available for pick up, called patient, not available

## 2014-04-24 ENCOUNTER — Other Ambulatory Visit: Payer: Self-pay | Admitting: *Deleted

## 2014-04-24 ENCOUNTER — Telehealth: Payer: Self-pay | Admitting: Orthopedic Surgery

## 2014-04-24 MED ORDER — OXYCODONE-ACETAMINOPHEN 5-325 MG PO TABS: 1.0000 | ORAL_TABLET | ORAL | Status: DC | PRN

## 2014-04-24 NOTE — Telephone Encounter (Signed)
Prescription available for pick up, called patient, no answer 

## 2014-04-24 NOTE — Telephone Encounter (Signed)
Patient called to request medication refill: oxyCODONE-acetaminophen (ROXICET) 5-325 MG per tablet - phone # is [phone]. (Next appointment scheduled 05/06/14).

## 2014-05-06 ENCOUNTER — Ambulatory Visit: Payer: Self-pay | Admitting: Orthopedic Surgery

## 2014-05-07 ENCOUNTER — Encounter: Payer: Self-pay | Admitting: Orthopedic Surgery

## 2014-05-19 ENCOUNTER — Ambulatory Visit (INDEPENDENT_AMBULATORY_CARE_PROVIDER_SITE_OTHER): Payer: Self-pay

## 2014-05-19 ENCOUNTER — Ambulatory Visit (INDEPENDENT_AMBULATORY_CARE_PROVIDER_SITE_OTHER): Payer: Self-pay | Admitting: Orthopedic Surgery

## 2014-05-19 ENCOUNTER — Encounter: Payer: Self-pay | Admitting: Orthopedic Surgery

## 2014-05-19 VITALS — BP 117/81 | Ht 71.0 in | Wt 142.0 lb

## 2014-05-19 DIAGNOSIS — S72411D Displaced unspecified condyle fracture of lower end of right femur, subsequent encounter for closed fracture with routine healing: Secondary | ICD-10-CM

## 2014-05-19 DIAGNOSIS — S7290XD Unspecified fracture of unspecified femur, subsequent encounter for closed fracture with routine healing: Secondary | ICD-10-CM

## 2014-05-19 MED ORDER — OXYCODONE-ACETAMINOPHEN 5-325 MG PO TABS: 1.0000 | ORAL_TABLET | ORAL | Status: DC | PRN
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
## 2014-05-26 ENCOUNTER — Telehealth: Payer: Self-pay | Admitting: Orthopedic Surgery

## 2014-05-26 ENCOUNTER — Other Ambulatory Visit: Payer: Self-pay | Admitting: *Deleted

## 2014-05-26 MED ORDER — OXYCODONE-ACETAMINOPHEN 5-325 MG PO TABS: 1.0000 | ORAL_TABLET | ORAL | Status: DC | PRN

## 2014-05-26 NOTE — Telephone Encounter (Signed)
Patient called to request refill of pain medication: oxyCODONE-acetaminophen (ROXICET) 5-325 MG per tablet  His ph# is [phone]

## 2014-05-26 NOTE — Telephone Encounter (Signed)
Prescription available,called patient, no answer, left vm 

## 2014-05-29 ENCOUNTER — Ambulatory Visit (HOSPITAL_COMMUNITY): Admission: RE | Admit: 2014-05-29 | Payer: MEDICAID | Source: Ambulatory Visit

## 2014-06-09 ENCOUNTER — Other Ambulatory Visit: Payer: Self-pay | Admitting: *Deleted

## 2014-06-09 ENCOUNTER — Other Ambulatory Visit: Payer: Self-pay | Admitting: Orthopedic Surgery

## 2014-06-09 ENCOUNTER — Telehealth: Payer: Self-pay | Admitting: Orthopedic Surgery

## 2014-06-09 MED ORDER — HYDROCODONE-ACETAMINOPHEN 7.5-325 MG PO TABS: 1.0000 | ORAL_TABLET | Freq: Four times a day (QID) | ORAL | Status: DC | PRN

## 2014-06-09 MED ORDER — OXYCODONE-ACETAMINOPHEN 5-325 MG PO TABS: 1.0000 | ORAL_TABLET | ORAL | Status: DC | PRN

## 2014-06-09 NOTE — Telephone Encounter (Signed)
Patient is calling to have pain meds refilled oxyCODONE-acetaminophen (ROXICET) 5-325 MG per tablet [place] to [facility] [place], Please advise?

## 2014-06-09 NOTE — Telephone Encounter (Signed)
PRESCRIPTION AVAILABLE FOR PICK UP, CALLED PATIENT, NO ANSWER 

## 2014-06-10 NOTE — Telephone Encounter (Signed)
Patient picked up Rx

## 2014-06-11 ENCOUNTER — Ambulatory Visit (HOSPITAL_COMMUNITY)
Admission: RE | Admit: 2014-06-11 | Discharge: 2014-06-11 | Disposition: A | Discharge status: Home / Self Care | Payer: MEDICAID | Source: Ambulatory Visit | Attending: Orthopedic Surgery | Admitting: Orthopedic Surgery

## 2014-06-11 DIAGNOSIS — Z5189 Encounter for other specified aftercare: Secondary | ICD-10-CM | POA: Insufficient documentation

## 2014-06-11 DIAGNOSIS — M25649 Stiffness of unspecified hand, not elsewhere classified: Secondary | ICD-10-CM | POA: Insufficient documentation

## 2014-06-11 DIAGNOSIS — S62319A Displaced fracture of base of unspecified metacarpal bone, initial encounter for closed fracture: Secondary | ICD-10-CM

## 2014-06-11 DIAGNOSIS — Z4689 Encounter for fitting and adjustment of other specified devices: Secondary | ICD-10-CM | POA: Insufficient documentation
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
## 2014-06-19 ENCOUNTER — Ambulatory Visit: Payer: Self-pay | Admitting: Orthopedic Surgery

## 2014-06-19 ENCOUNTER — Encounter: Payer: Self-pay | Admitting: Orthopedic Surgery

## 2014-06-30 ENCOUNTER — Other Ambulatory Visit: Payer: Self-pay | Admitting: *Deleted

## 2014-06-30 ENCOUNTER — Telehealth: Payer: Self-pay | Admitting: Orthopedic Surgery

## 2014-06-30 MED ORDER — HYDROCODONE-ACETAMINOPHEN 7.5-325 MG PO TABS: 1.0000 | ORAL_TABLET | Freq: Four times a day (QID) | ORAL | Status: DC | PRN

## 2014-06-30 NOTE — Telephone Encounter (Signed)
Patient called to request refill on his Hydrocodone 7.5-325/states he has an upcoming re-scheduled appointment.  Please advise.  Please use phone # [phone]

## 2014-06-30 NOTE — Telephone Encounter (Signed)
Re-entering note, as it was closed prior to routing:   Charles [name] at 06/30/2014 3:28 PM    Status: Signed       Patient called to request refill on his Hydrocodone 7.5-325/states he has an upcoming re-scheduled appointment. Please advise. Please use phone # [phone]

## 2014-07-01 NOTE — Telephone Encounter (Signed)
Prescription available for pick up, called patient, unavailable, left message

## 2014-07-10 ENCOUNTER — Ambulatory Visit: Payer: Self-pay | Admitting: Orthopedic Surgery

## 2014-07-10 ENCOUNTER — Encounter: Payer: Self-pay | Admitting: Orthopedic Surgery

## 2016-06-08 ENCOUNTER — Encounter (HOSPITAL_COMMUNITY): Payer: Self-pay | Admitting: Emergency Medicine

## 2016-06-08 ENCOUNTER — Emergency Department (HOSPITAL_COMMUNITY): Admission: EM | Admit: 2016-06-08 | Discharge: 2016-06-08 | Discharge status: Against Medical Advice | Payer: Self-pay

## 2016-06-08 ENCOUNTER — Emergency Department (HOSPITAL_COMMUNITY)
Admission: EM | Admit: 2016-06-08 | Discharge: 2016-06-08 | Disposition: A | Discharge status: Home / Self Care | Payer: Self-pay | Attending: Emergency Medicine | Admitting: Emergency Medicine

## 2016-06-08 DIAGNOSIS — F17210 Nicotine dependence, cigarettes, uncomplicated: Secondary | ICD-10-CM | POA: Insufficient documentation

## 2016-06-08 DIAGNOSIS — S61011A Laceration without foreign body of right thumb without damage to nail, initial encounter: Secondary | ICD-10-CM | POA: Insufficient documentation

## 2016-06-08 DIAGNOSIS — S41111A Laceration without foreign body of right upper arm, initial encounter: Secondary | ICD-10-CM | POA: Insufficient documentation

## 2016-06-08 DIAGNOSIS — W25XXXA Contact with sharp glass, initial encounter: Secondary | ICD-10-CM | POA: Insufficient documentation

## 2016-06-08 DIAGNOSIS — Y9389 Activity, other specified: Secondary | ICD-10-CM | POA: Insufficient documentation

## 2016-06-08 DIAGNOSIS — Y929 Unspecified place or not applicable: Secondary | ICD-10-CM | POA: Insufficient documentation

## 2016-06-08 DIAGNOSIS — Y999 Unspecified external cause status: Secondary | ICD-10-CM | POA: Insufficient documentation

## 2016-06-08 MED ORDER — CEPHALEXIN 500 MG PO CAPS
500.0000 mg | ORAL_CAPSULE | Freq: Once | ORAL | Status: AC
  Administered 2016-06-08: 500 mg via ORAL
  Filled 2016-06-08: qty 1

## 2016-06-08 MED ORDER — LIDOCAINE HCL (PF) 2 % IJ SOLN
10.0000 mL | Freq: Once | INTRAMUSCULAR | Status: AC
  Administered 2016-06-08: 10 mL via INTRADERMAL
  Filled 2016-06-08: qty 10

## 2016-06-08 MED ORDER — HYDROCODONE-ACETAMINOPHEN 5-325 MG PO TABS
1.0000 | ORAL_TABLET | Freq: Once | ORAL | Status: AC
  Administered 2016-06-08: 1 via ORAL
  Filled 2016-06-08: qty 1

## 2016-06-08 MED ORDER — CEPHALEXIN 500 MG PO CAPS: 500.0000 mg | ORAL_CAPSULE | Freq: Four times a day (QID) | ORAL | 0 refills | Status: DC

## 2016-06-08 MED ORDER — HYDROCODONE-ACETAMINOPHEN 5-325 MG PO TABS: ORAL_TABLET | ORAL | 0 refills | Status: DC

## 2016-06-08 MED ORDER — IBUPROFEN 600 MG PO TABS: 600.0000 mg | ORAL_TABLET | Freq: Four times a day (QID) | ORAL | 0 refills | Status: AC | PRN
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
## 2016-06-08 NOTE — Discharge Instructions (Signed)
Keep the wounds clean and bandaged.  Sutures out in 10 days.  Call Dr. Mort SawyersHarrison's office to arrange a follow-up appt.
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
## 2019-09-30 ENCOUNTER — Ambulatory Visit: Payer: Self-pay | Attending: Internal Medicine

## 2019-09-30 DIAGNOSIS — Z20822 Contact with and (suspected) exposure to covid-19: Secondary | ICD-10-CM | POA: Insufficient documentation

## 2019-10-01 ENCOUNTER — Ambulatory Visit: Payer: Self-pay

## 2019-10-01 ENCOUNTER — Other Ambulatory Visit: Payer: Self-pay

## 2019-10-01 DIAGNOSIS — Z20822 Contact with and (suspected) exposure to covid-19: Secondary | ICD-10-CM

## 2019-10-02 LAB — NOVEL CORONAVIRUS, NAA: SARS-CoV-2, NAA: NOT DETECTED

## 2019-12-27 ENCOUNTER — Ambulatory Visit: Payer: Self-pay | Attending: Internal Medicine

## 2019-12-27 ENCOUNTER — Other Ambulatory Visit: Payer: Self-pay

## 2019-12-27 DIAGNOSIS — Z20822 Contact with and (suspected) exposure to covid-19: Secondary | ICD-10-CM | POA: Insufficient documentation

## 2019-12-28 LAB — NOVEL CORONAVIRUS, NAA: SARS-CoV-2, NAA: NOT DETECTED

## 2019-12-28 LAB — SARS-COV-2, NAA 2 DAY TAT

## 2020-09-22 ENCOUNTER — Encounter (HOSPITAL_COMMUNITY): Payer: Self-pay | Admitting: *Deleted

## 2020-09-22 ENCOUNTER — Other Ambulatory Visit: Payer: Self-pay

## 2020-09-22 ENCOUNTER — Emergency Department (HOSPITAL_COMMUNITY)
Admission: EM | Admit: 2020-09-22 | Discharge: 2020-09-22 | Disposition: A | Discharge status: Home / Self Care | Payer: Self-pay | Attending: Emergency Medicine | Admitting: Emergency Medicine

## 2020-09-22 ENCOUNTER — Emergency Department (HOSPITAL_COMMUNITY): Payer: Self-pay

## 2020-09-22 DIAGNOSIS — F17210 Nicotine dependence, cigarettes, uncomplicated: Secondary | ICD-10-CM | POA: Insufficient documentation

## 2020-09-22 DIAGNOSIS — S6992XA Unspecified injury of left wrist, hand and finger(s), initial encounter: Secondary | ICD-10-CM | POA: Insufficient documentation

## 2020-09-22 DIAGNOSIS — X501XXA Overexertion from prolonged static or awkward postures, initial encounter: Secondary | ICD-10-CM | POA: Insufficient documentation

## 2020-09-22 DIAGNOSIS — F1590 Other stimulant use, unspecified, uncomplicated: Secondary | ICD-10-CM | POA: Insufficient documentation

## 2020-09-22 NOTE — Discharge Instructions (Addendum)
Follow-up with hand surgery.  Call the number on your discharge paperwork to see hand surgeon

## 2020-09-22 NOTE — ED Triage Notes (Signed)
Pt states he is unable to bend left thumb (nondominant hand)  for 3 months.  Pt had a cut back in September.
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
## 2020-09-28 ENCOUNTER — Ambulatory Visit: Payer: Self-pay | Admitting: Orthopedic Surgery

## 2020-10-01 ENCOUNTER — Ambulatory Visit: Payer: Self-pay | Admitting: Orthopedic Surgery

## 2020-10-05 ENCOUNTER — Encounter: Payer: Self-pay | Admitting: Orthopedic Surgery

## 2020-10-05 ENCOUNTER — Other Ambulatory Visit: Payer: Self-pay

## 2020-10-05 ENCOUNTER — Ambulatory Visit (INDEPENDENT_AMBULATORY_CARE_PROVIDER_SITE_OTHER): Payer: Self-pay | Admitting: Orthopedic Surgery

## 2020-10-05 VITALS — BP 109/74 | HR 78 | Ht 70.0 in | Wt 144.0 lb

## 2020-10-05 DIAGNOSIS — S66022A Laceration of long flexor muscle, fascia and tendon of left thumb at wrist and hand level, initial encounter: Secondary | ICD-10-CM
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
## 2021-04-07 IMAGING — DX DG FINGER THUMB 2+V*L*
3 series · 3 of 3 positions shown · non-contrast
Comparison: None.

CLINICAL DATA: Left thumb laceration 3 months ago.

EXAM:
LEFT THUMB 2+V

[finger ap]
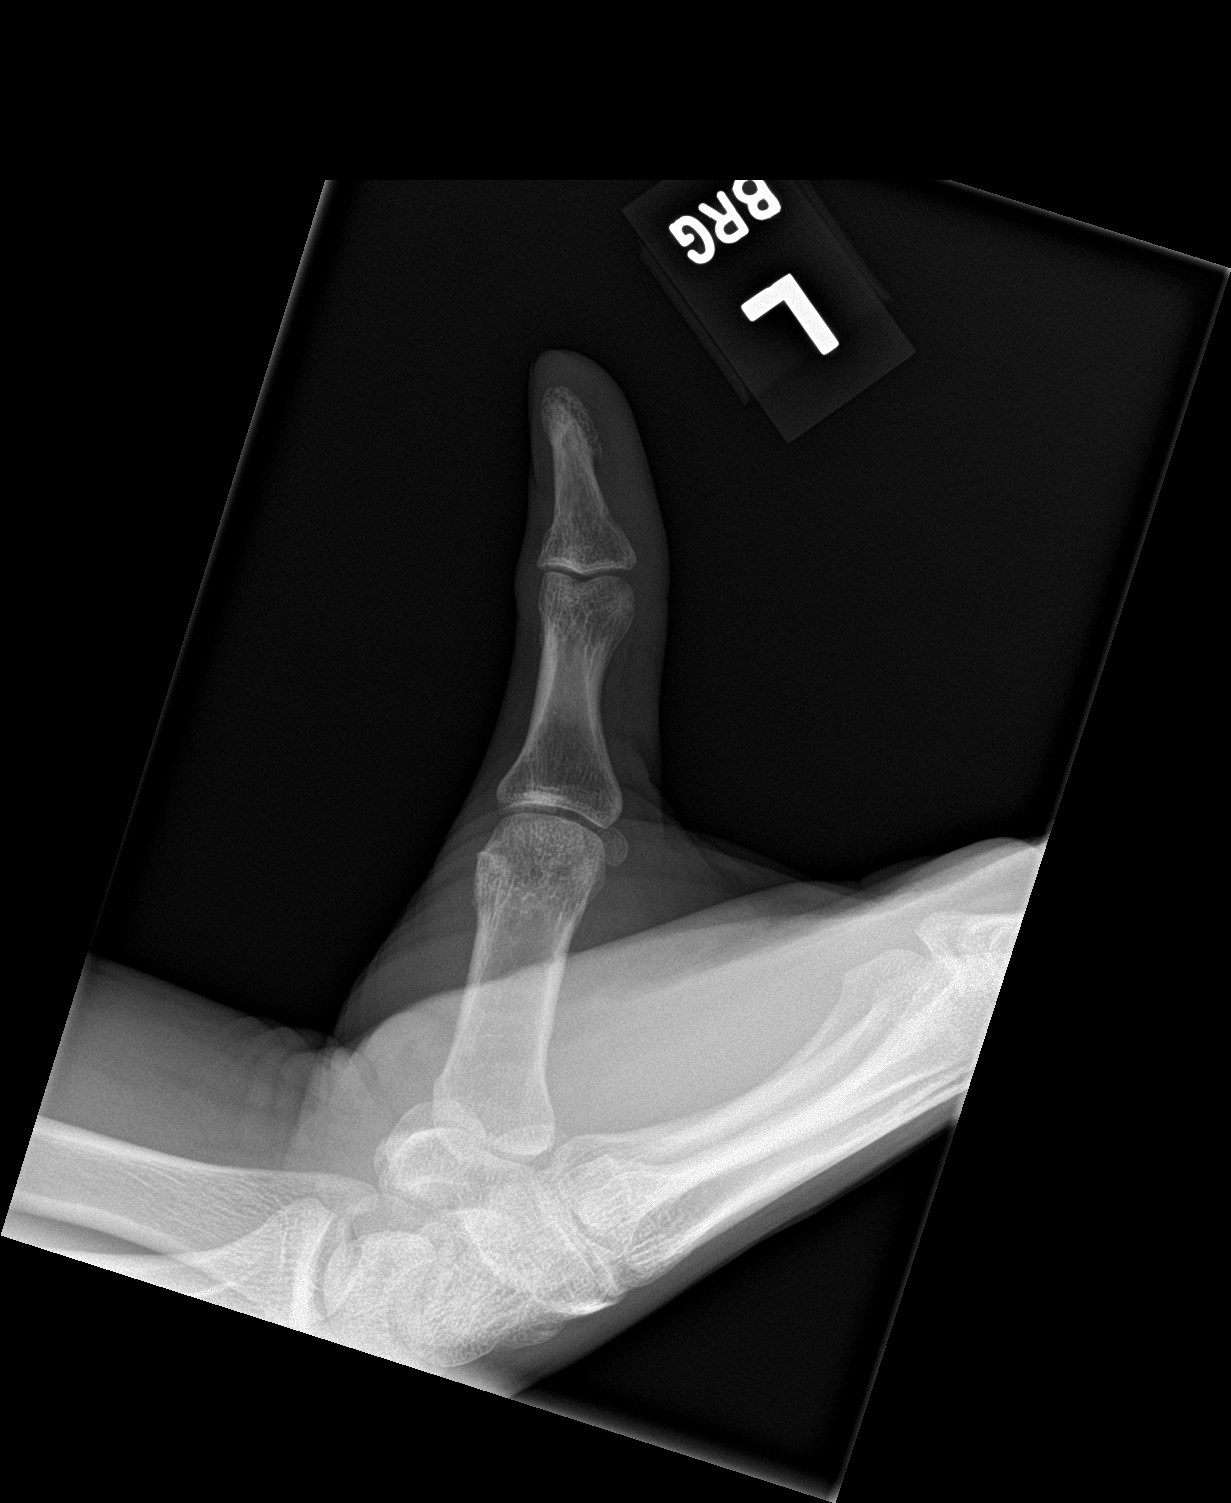

[finger obl]
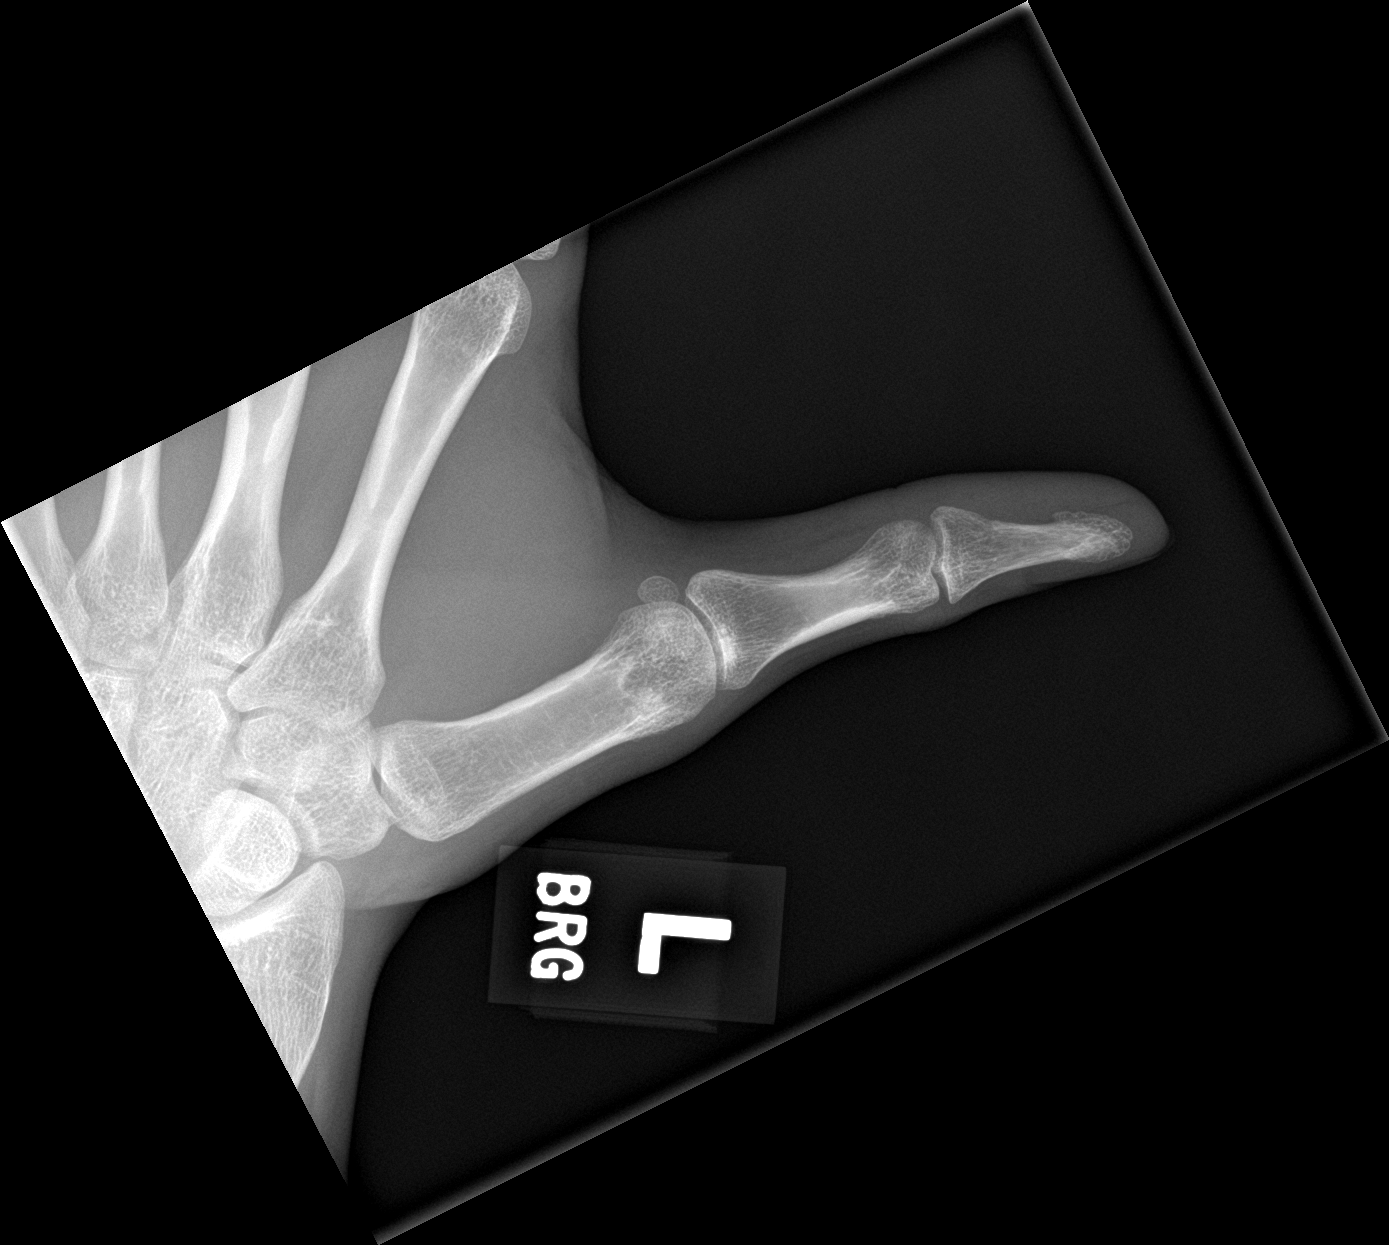

[finger lat]
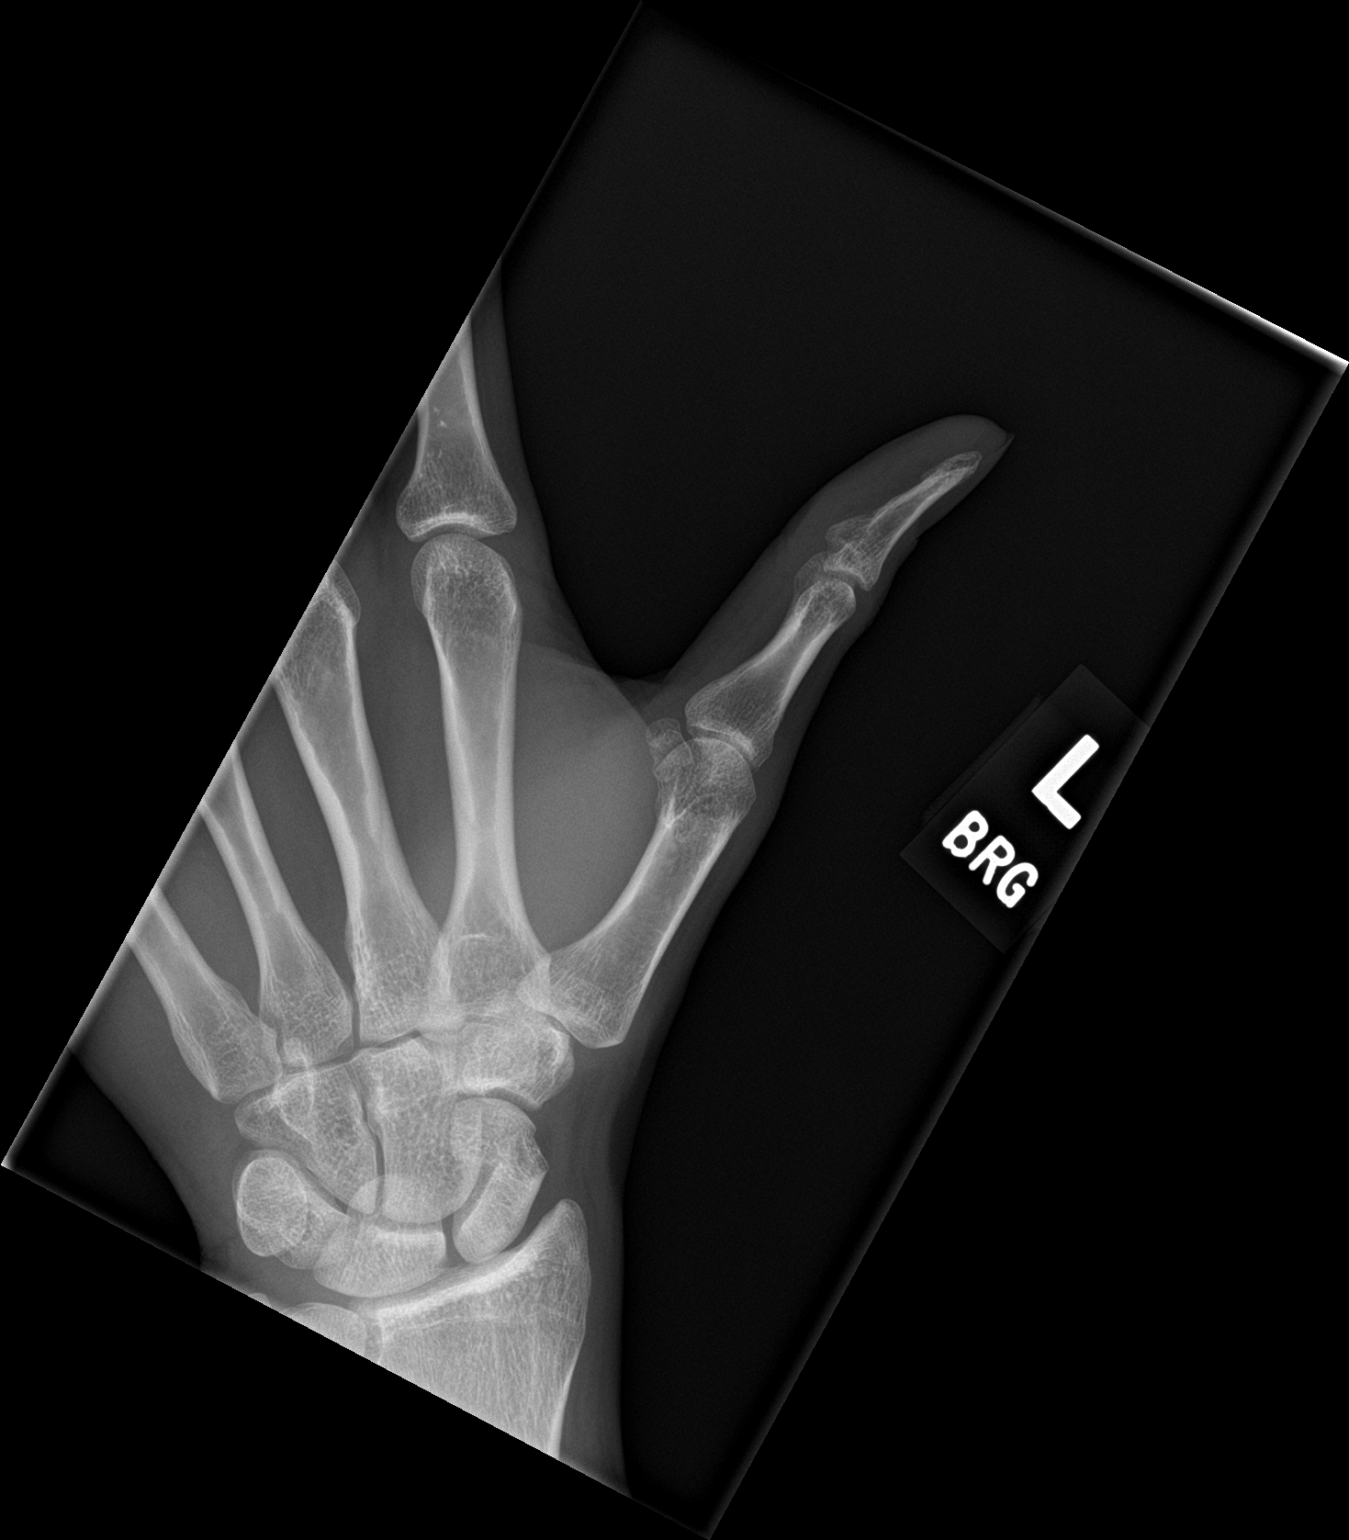

[3 of 3 positions shown; findings below may reference images not displayed]

FINDINGS: There is no evidence of fracture or dislocation. There is no
evidence of arthropathy or other focal bone abnormality. Soft
tissues are unremarkable.
IMPRESSION: Negative.

## 2022-11-03 ENCOUNTER — Encounter: Payer: Self-pay | Admitting: Radiology
